# Patient Record
Sex: Male | Born: 1990
Health system: Southern US, Community
[De-identification: ages and names within clinical notes are randomized; demographics above are authoritative.]

---

## 2019-02-14 ENCOUNTER — Emergency Department (HOSPITAL_COMMUNITY): Payer: Self-pay

## 2019-02-14 ENCOUNTER — Inpatient Hospital Stay (HOSPITAL_COMMUNITY)
Admission: EM | Admit: 2019-02-14 | Discharge: 2019-02-18 | DRG: 493 | Disposition: A | Discharge status: Home Health | Payer: Self-pay | Attending: General Surgery | Admitting: General Surgery

## 2019-02-14 DIAGNOSIS — T148XXA Other injury of unspecified body region, initial encounter: Secondary | ICD-10-CM

## 2019-02-14 DIAGNOSIS — N179 Acute kidney failure, unspecified: Secondary | ICD-10-CM | POA: Diagnosis present

## 2019-02-14 DIAGNOSIS — J982 Interstitial emphysema: Secondary | ICD-10-CM

## 2019-02-14 DIAGNOSIS — S82202B Unspecified fracture of shaft of left tibia, initial encounter for open fracture type I or II: Secondary | ICD-10-CM

## 2019-02-14 DIAGNOSIS — S80212A Abrasion, left knee, initial encounter: Secondary | ICD-10-CM

## 2019-02-14 DIAGNOSIS — Z20828 Contact with and (suspected) exposure to other viral communicable diseases: Secondary | ICD-10-CM | POA: Diagnosis present

## 2019-02-14 DIAGNOSIS — S80211A Abrasion, right knee, initial encounter: Secondary | ICD-10-CM

## 2019-02-14 DIAGNOSIS — Z419 Encounter for procedure for purposes other than remedying health state, unspecified: Secondary | ICD-10-CM

## 2019-02-14 DIAGNOSIS — S060X9A Concussion with loss of consciousness of unspecified duration, initial encounter: Secondary | ICD-10-CM | POA: Diagnosis present

## 2019-02-14 DIAGNOSIS — S060XAA Concussion with loss of consciousness status unknown, initial encounter: Secondary | ICD-10-CM | POA: Diagnosis present

## 2019-02-14 DIAGNOSIS — E876 Hypokalemia: Secondary | ICD-10-CM

## 2019-02-14 DIAGNOSIS — T754XXA Electrocution, initial encounter: Secondary | ICD-10-CM

## 2019-02-14 DIAGNOSIS — Y92410 Unspecified street and highway as the place of occurrence of the external cause: Secondary | ICD-10-CM

## 2019-02-14 DIAGNOSIS — S92002A Unspecified fracture of left calcaneus, initial encounter for closed fracture: Secondary | ICD-10-CM | POA: Diagnosis present

## 2019-02-14 DIAGNOSIS — S82842A Displaced bimalleolar fracture of left lower leg, initial encounter for closed fracture: Secondary | ICD-10-CM | POA: Diagnosis present

## 2019-02-14 DIAGNOSIS — S82402B Unspecified fracture of shaft of left fibula, initial encounter for open fracture type I or II: Secondary | ICD-10-CM

## 2019-02-14 DIAGNOSIS — S93432A Sprain of tibiofibular ligament of left ankle, initial encounter: Secondary | ICD-10-CM | POA: Diagnosis present

## 2019-02-14 DIAGNOSIS — S82251B Displaced comminuted fracture of shaft of right tibia, initial encounter for open fracture type I or II: Secondary | ICD-10-CM | POA: Diagnosis present

## 2019-02-14 DIAGNOSIS — S82892B Other fracture of left lower leg, initial encounter for open fracture type I or II: Secondary | ICD-10-CM

## 2019-02-14 DIAGNOSIS — R739 Hyperglycemia, unspecified: Secondary | ICD-10-CM | POA: Diagnosis present

## 2019-02-14 DIAGNOSIS — S9305XA Dislocation of left ankle joint, initial encounter: Secondary | ICD-10-CM | POA: Diagnosis present

## 2019-02-14 DIAGNOSIS — S82451B Displaced comminuted fracture of shaft of right fibula, initial encounter for open fracture type I or II: Principal | ICD-10-CM | POA: Diagnosis present

## 2019-02-14 DIAGNOSIS — T1490XA Injury, unspecified, initial encounter: Secondary | ICD-10-CM

## 2019-02-14 LAB — CBC
HCT: 50.7 % (ref 39.0–52.0)
Hemoglobin: 16.7 g/dL (ref 13.0–17.0)
MCH: 30.6 pg (ref 26.0–34.0)
MCHC: 32.9 g/dL (ref 30.0–36.0)
MCV: 92.9 fL (ref 80.0–100.0)
Platelets: 482 10*3/uL — ABNORMAL HIGH (ref 150–400)
RBC: 5.46 MIL/uL (ref 4.22–5.81)
RDW: 12 % (ref 11.5–15.5)
WBC: 37 10*3/uL — ABNORMAL HIGH (ref 4.0–10.5)
nRBC: 0 % (ref 0.0–0.2)

## 2019-02-14 LAB — PROTIME-INR
INR: 1 (ref 0.8–1.2)
Prothrombin Time: 13.2 seconds (ref 11.4–15.2)

## 2019-02-14 LAB — ETHANOL: Alcohol, Ethyl (B): <10 mg/dL (ref <10)

## 2019-02-14 MED ORDER — TETANUS-DIPHTH-ACELL PERTUSSIS 5-2.5-18.5 LF-MCG/0.5 IM SUSP
0.5000 mL | Freq: Once | INTRAMUSCULAR | Status: AC
  Administered 2019-02-15: 0.5 mL via INTRAMUSCULAR
  Filled 2019-02-14: qty 0.5

## 2019-02-14 MED ORDER — CLINDAMYCIN PHOSPHATE 600 MG/50ML IV SOLN
600.0000 mg | Freq: Once | INTRAVENOUS | Status: AC
  Administered 2019-02-15: 600 mg via INTRAVENOUS
  Filled 2019-02-14: qty 50

## 2019-02-14 NOTE — ED Notes (Addendum)
Dr Palumbo informed of chem 8 results  

## 2019-02-15 ENCOUNTER — Inpatient Hospital Stay (HOSPITAL_COMMUNITY): Payer: Self-pay | Admitting: Anesthesiology

## 2019-02-15 ENCOUNTER — Encounter (HOSPITAL_COMMUNITY): Payer: Self-pay | Admitting: Emergency Medicine

## 2019-02-15 ENCOUNTER — Inpatient Hospital Stay (HOSPITAL_COMMUNITY): Payer: Self-pay

## 2019-02-15 ENCOUNTER — Encounter (HOSPITAL_COMMUNITY): Admission: EM | Disposition: A | Discharge status: Home Health | Payer: Self-pay | Source: Home / Self Care

## 2019-02-15 ENCOUNTER — Other Ambulatory Visit: Payer: Self-pay

## 2019-02-15 ENCOUNTER — Other Ambulatory Visit (HOSPITAL_COMMUNITY): Payer: Self-pay | Admitting: Orthopedic Surgery

## 2019-02-15 DIAGNOSIS — S060XAA Concussion with loss of consciousness status unknown, initial encounter: Secondary | ICD-10-CM | POA: Diagnosis present

## 2019-02-15 DIAGNOSIS — S060X9A Concussion with loss of consciousness of unspecified duration, initial encounter: Secondary | ICD-10-CM | POA: Diagnosis present

## 2019-02-15 HISTORY — PX: I & D EXTREMITY: SHX5045

## 2019-02-15 HISTORY — PX: EXTERNAL FIXATION LEG: SHX1549

## 2019-02-15 LAB — TYPE AND SCREEN
ABO/RH(D): O POS
Antibody Screen: NEGATIVE

## 2019-02-15 LAB — URINALYSIS, ROUTINE W REFLEX MICROSCOPIC
Bilirubin Urine: NEGATIVE
Glucose, UA: 50 mg/dL — AB
Ketones, ur: NEGATIVE mg/dL
Leukocytes,Ua: NEGATIVE
Nitrite: NEGATIVE
Protein, ur: NEGATIVE mg/dL
Specific Gravity, Urine: 1.033 — ABNORMAL HIGH (ref 1.005–1.030)
pH: 6 (ref 5.0–8.0)

## 2019-02-15 LAB — COMPREHENSIVE METABOLIC PANEL
ALT: 34 U/L (ref 0–44)
AST: 47 U/L — ABNORMAL HIGH (ref 15–41)
Albumin: 4.1 g/dL (ref 3.5–5.0)
Alkaline Phosphatase: 66 U/L (ref 38–126)
Anion gap: 16 — ABNORMAL HIGH (ref 5–15)
BUN: 8 mg/dL (ref 6–20)
CO2: 18 mmol/L — ABNORMAL LOW (ref 22–32)
Calcium: 8.4 mg/dL — ABNORMAL LOW (ref 8.9–10.3)
Chloride: 104 mmol/L (ref 98–111)
Creatinine, Ser: 1.37 mg/dL — ABNORMAL HIGH (ref 0.61–1.24)
GFR calc Af Amer: 36 mL/min — ABNORMAL LOW (ref >60)
GFR calc non Af Amer: 31 mL/min — ABNORMAL LOW (ref >60)
Glucose, Bld: 156 mg/dL — ABNORMAL HIGH (ref 70–99)
Potassium: 3.1 mmol/L — ABNORMAL LOW (ref 3.5–5.1)
Sodium: 138 mmol/L (ref 135–145)
Total Bilirubin: 0.9 mg/dL (ref 0.3–1.2)
Total Protein: 7 g/dL (ref 6.5–8.1)

## 2019-02-15 LAB — CDS SEROLOGY

## 2019-02-15 LAB — I-STAT CHEM 8, ED
BUN: 10 mg/dL (ref 6–20)
BUN: 12 mg/dL (ref 6–20)
Calcium, Ion: 0.86 mmol/L — CL (ref 1.15–1.40)
Calcium, Ion: 0.96 mmol/L — ABNORMAL LOW (ref 1.15–1.40)
Chloride: 107 mmol/L (ref 98–111)
Chloride: 108 mmol/L (ref 98–111)
Creatinine, Ser: 1.2 mg/dL (ref 0.61–1.24)
Creatinine, Ser: 1.3 mg/dL — ABNORMAL HIGH (ref 0.61–1.24)
Glucose, Bld: 153 mg/dL — ABNORMAL HIGH (ref 70–99)
Glucose, Bld: 162 mg/dL — ABNORMAL HIGH (ref 70–99)
HCT: 48 % (ref 39.0–52.0)
HCT: 51 % (ref 39.0–52.0)
Hemoglobin: 16.3 g/dL (ref 13.0–17.0)
Hemoglobin: 17.3 g/dL — ABNORMAL HIGH (ref 13.0–17.0)
Potassium: 2.9 mmol/L — ABNORMAL LOW (ref 3.5–5.1)
Potassium: 6.8 mmol/L (ref 3.5–5.1)
Sodium: 138 mmol/L (ref 135–145)
Sodium: 142 mmol/L (ref 135–145)
TCO2: 20 mmol/L — ABNORMAL LOW (ref 22–32)
TCO2: 22 mmol/L (ref 22–32)

## 2019-02-15 LAB — POCT I-STAT, CHEM 8
BUN: 12 mg/dL (ref 6–20)
Calcium, Ion: 0.86 mmol/L — CL (ref 1.15–1.40)
Chloride: 108 mmol/L (ref 98–111)
Creatinine, Ser: 1.3 mg/dL — ABNORMAL HIGH (ref 0.61–1.24)
Glucose, Bld: 162 mg/dL — ABNORMAL HIGH (ref 70–99)
HCT: 48 % (ref 39.0–52.0)
Hemoglobin: 16.3 g/dL (ref 13.0–17.0)
Potassium: 6.8 mmol/L (ref 3.5–5.1)
Sodium: 138 mmol/L (ref 135–145)
TCO2: 22 mmol/L (ref 22–32)

## 2019-02-15 LAB — BASIC METABOLIC PANEL
Anion gap: 8 (ref 5–15)
BUN: 8 mg/dL (ref 6–20)
CO2: 22 mmol/L (ref 22–32)
Calcium: 8.1 mg/dL — ABNORMAL LOW (ref 8.9–10.3)
Chloride: 110 mmol/L (ref 98–111)
Creatinine, Ser: 1.27 mg/dL — ABNORMAL HIGH (ref 0.61–1.24)
GFR calc Af Amer: >60 mL/min (ref >60)
GFR calc non Af Amer: >60 mL/min (ref >60)
Glucose, Bld: 164 mg/dL — ABNORMAL HIGH (ref 70–99)
Potassium: 4.3 mmol/L (ref 3.5–5.1)
Sodium: 140 mmol/L (ref 135–145)

## 2019-02-15 LAB — ABO/RH: ABO/RH(D): O POS

## 2019-02-15 LAB — LACTIC ACID, PLASMA: Lactic Acid, Venous: 5.3 mmol/L (ref 0.5–1.9)

## 2019-02-15 LAB — SARS CORONAVIRUS 2 BY RT PCR (HOSPITAL ORDER, PERFORMED IN ~~LOC~~ HOSPITAL LAB): SARS Coronavirus 2: NEGATIVE

## 2019-02-15 LAB — MAGNESIUM
Magnesium: 1.8 mg/dL (ref 1.7–2.4)
Magnesium: 1.9 mg/dL (ref 1.7–2.4)

## 2019-02-15 LAB — MRSA PCR SCREENING: MRSA by PCR: NEGATIVE

## 2019-02-15 SURGERY — EXTERNAL FIXATION, LOWER EXTREMITY
Anesthesia: General | Site: Leg Lower | Laterality: Right

## 2019-02-15 MED ORDER — CEFAZOLIN SODIUM-DEXTROSE 2-4 GM/100ML-% IV SOLN: 2.0000 g | INTRAVENOUS | Status: DC

## 2019-02-15 MED ORDER — HYDRALAZINE HCL 20 MG/ML IJ SOLN: 10.0000 mg | INTRAMUSCULAR | Status: DC | PRN

## 2019-02-15 MED ORDER — SODIUM CHLORIDE 0.9 % IV SOLN
INTRAVENOUS | Status: DC | PRN
  Administered 2019-02-15: 25 ug/min via INTRAVENOUS

## 2019-02-15 MED ORDER — MIDAZOLAM HCL 5 MG/5ML IJ SOLN
INTRAMUSCULAR | Status: DC | PRN
  Administered 2019-02-15: 1 mg via INTRAVENOUS

## 2019-02-15 MED ORDER — DEXAMETHASONE SODIUM PHOSPHATE 10 MG/ML IJ SOLN
INTRAMUSCULAR | Status: AC
  Filled 2019-02-15: qty 1

## 2019-02-15 MED ORDER — PANTOPRAZOLE SODIUM 40 MG PO TBEC
40.0000 mg | DELAYED_RELEASE_TABLET | Freq: Every day | ORAL | Status: DC
  Administered 2019-02-15 – 2019-02-18 (×3): 40 mg via ORAL
  Filled 2019-02-15 (×3): qty 1

## 2019-02-15 MED ORDER — ONDANSETRON HCL 4 MG/2ML IJ SOLN
INTRAMUSCULAR | Status: AC
  Filled 2019-02-15: qty 2

## 2019-02-15 MED ORDER — SUCCINYLCHOLINE CHLORIDE 200 MG/10ML IV SOSY
PREFILLED_SYRINGE | INTRAVENOUS | Status: AC
  Filled 2019-02-15: qty 10

## 2019-02-15 MED ORDER — PHENYLEPHRINE HCL (PRESSORS) 10 MG/ML IV SOLN
INTRAVENOUS | Status: DC | PRN
  Administered 2019-02-15: 80 ug via INTRAVENOUS

## 2019-02-15 MED ORDER — LACTATED RINGERS IV SOLN: INTRAVENOUS | Status: DC

## 2019-02-15 MED ORDER — SODIUM CHLORIDE 0.9 % IR SOLN
Status: DC | PRN
  Administered 2019-02-15: 1000 mL

## 2019-02-15 MED ORDER — PROPOFOL 10 MG/ML IV BOLUS
INTRAVENOUS | Status: DC | PRN
  Administered 2019-02-15: 150 mg via INTRAVENOUS

## 2019-02-15 MED ORDER — MAGNESIUM CITRATE PO SOLN: 1.0000 | Freq: Once | ORAL | Status: DC | PRN

## 2019-02-15 MED ORDER — ONDANSETRON HCL 4 MG PO TABS: 4.0000 mg | ORAL_TABLET | Freq: Four times a day (QID) | ORAL | Status: DC | PRN

## 2019-02-15 MED ORDER — CEFAZOLIN SODIUM-DEXTROSE 1-4 GM/50ML-% IV SOLN
1.0000 g | Freq: Four times a day (QID) | INTRAVENOUS | Status: AC
  Administered 2019-02-15 (×3): 1 g via INTRAVENOUS
  Filled 2019-02-15 (×3): qty 50

## 2019-02-15 MED ORDER — MIDAZOLAM HCL 2 MG/2ML IJ SOLN
INTRAMUSCULAR | Status: AC
  Filled 2019-02-15: qty 2

## 2019-02-15 MED ORDER — ACETAMINOPHEN 325 MG PO TABS: 325.0000 mg | ORAL_TABLET | Freq: Four times a day (QID) | ORAL | Status: DC | PRN

## 2019-02-15 MED ORDER — POTASSIUM CHLORIDE IN NACL 20-0.9 MEQ/L-% IV SOLN
INTRAVENOUS | Status: DC
  Administered 2019-02-15 – 2019-02-16 (×3): via INTRAVENOUS
  Filled 2019-02-15 (×5): qty 1000

## 2019-02-15 MED ORDER — SENNA 8.6 MG PO TABS
1.0000 | ORAL_TABLET | Freq: Two times a day (BID) | ORAL | Status: DC
  Administered 2019-02-15 (×2): 8.6 mg via ORAL
  Filled 2019-02-15 (×2): qty 1

## 2019-02-15 MED ORDER — CEFAZOLIN SODIUM-DEXTROSE 2-4 GM/100ML-% IV SOLN
INTRAVENOUS | Status: AC
  Filled 2019-02-15: qty 100

## 2019-02-15 MED ORDER — LIDOCAINE 2% (20 MG/ML) 5 ML SYRINGE
INTRAMUSCULAR | Status: DC | PRN
  Administered 2019-02-15: 40 mg via INTRAVENOUS

## 2019-02-15 MED ORDER — SUGAMMADEX SODIUM 200 MG/2ML IV SOLN
INTRAVENOUS | Status: DC | PRN
  Administered 2019-02-15: 200 mg via INTRAVENOUS

## 2019-02-15 MED ORDER — ENOXAPARIN SODIUM 40 MG/0.4ML ~~LOC~~ SOLN: 40.0000 mg | SUBCUTANEOUS | Status: DC

## 2019-02-15 MED ORDER — OXYCODONE HCL 5 MG PO TABS: 5.0000 mg | ORAL_TABLET | ORAL | Status: DC | PRN

## 2019-02-15 MED ORDER — FENTANYL CITRATE (PF) 100 MCG/2ML IJ SOLN
50.0000 ug | Freq: Once | INTRAMUSCULAR | Status: AC
  Administered 2019-02-15: 50 ug via INTRAVENOUS

## 2019-02-15 MED ORDER — POTASSIUM CHLORIDE 10 MEQ/100ML IV SOLN
10.0000 meq | INTRAVENOUS | Status: AC
  Administered 2019-02-15 (×3): 10 meq via INTRAVENOUS
  Filled 2019-02-15 (×4): qty 100

## 2019-02-15 MED ORDER — MAGNESIUM SULFATE IN D5W 1-5 GM/100ML-% IV SOLN
1.0000 g | Freq: Once | INTRAVENOUS | Status: AC
  Administered 2019-02-15: 1 g via INTRAVENOUS
  Filled 2019-02-15: qty 100

## 2019-02-15 MED ORDER — OXYCODONE HCL 5 MG PO TABS: 10.0000 mg | ORAL_TABLET | ORAL | Status: DC | PRN

## 2019-02-15 MED ORDER — HYDROMORPHONE HCL 1 MG/ML IJ SOLN: 1.0000 mg | INTRAMUSCULAR | Status: DC | PRN

## 2019-02-15 MED ORDER — POVIDONE-IODINE 10 % EX SWAB: 2.0000 "application " | Freq: Once | CUTANEOUS | Status: DC

## 2019-02-15 MED ORDER — CHLORHEXIDINE GLUCONATE 4 % EX LIQD
60.0000 mL | Freq: Once | CUTANEOUS | Status: DC
  Filled 2019-02-15 (×2): qty 60

## 2019-02-15 MED ORDER — ALBUMIN HUMAN 5 % IV SOLN
INTRAVENOUS | Status: DC | PRN
  Administered 2019-02-15: 04:00:00 via INTRAVENOUS

## 2019-02-15 MED ORDER — CEFAZOLIN SODIUM-DEXTROSE 1-4 GM/50ML-% IV SOLN: 1.0000 g | Freq: Three times a day (TID) | INTRAVENOUS | Status: DC

## 2019-02-15 MED ORDER — PANTOPRAZOLE SODIUM 40 MG IV SOLR
40.0000 mg | Freq: Every day | INTRAVENOUS | Status: DC
  Filled 2019-02-15 (×2): qty 40

## 2019-02-15 MED ORDER — FENTANYL CITRATE (PF) 100 MCG/2ML IJ SOLN
INTRAMUSCULAR | Status: DC | PRN
  Administered 2019-02-15 (×5): 50 ug via INTRAVENOUS
  Administered 2019-02-15: 100 ug via INTRAVENOUS
  Administered 2019-02-15: 50 ug via INTRAVENOUS

## 2019-02-15 MED ORDER — ROCURONIUM BROMIDE 100 MG/10ML IV SOLN
INTRAVENOUS | Status: DC | PRN
  Administered 2019-02-15: 20 mg via INTRAVENOUS
  Administered 2019-02-15: 50 mg via INTRAVENOUS
  Administered 2019-02-15: 20 mg via INTRAVENOUS
  Administered 2019-02-15 (×2): 10 mg via INTRAVENOUS

## 2019-02-15 MED ORDER — LIDOCAINE 2% (20 MG/ML) 5 ML SYRINGE
INTRAMUSCULAR | Status: AC
  Filled 2019-02-15: qty 5

## 2019-02-15 MED ORDER — HYDROMORPHONE HCL 1 MG/ML IJ SOLN
0.5000 mg | INTRAMUSCULAR | Status: DC | PRN
  Administered 2019-02-15 (×5): 2 mg via INTRAVENOUS
  Administered 2019-02-16: 1 mg via INTRAVENOUS
  Administered 2019-02-16: 2 mg via INTRAVENOUS
  Filled 2019-02-15 (×2): qty 2
  Filled 2019-02-15: qty 1
  Filled 2019-02-15 (×3): qty 2

## 2019-02-15 MED ORDER — ACETAMINOPHEN 160 MG/5ML PO SOLN: 325.0000 mg | Freq: Once | ORAL | Status: DC | PRN

## 2019-02-15 MED ORDER — HYDROMORPHONE HCL 1 MG/ML IJ SOLN
0.5000 mg | INTRAMUSCULAR | Status: DC | PRN
  Administered 2019-02-15: 1 mg via INTRAVENOUS
  Filled 2019-02-15: qty 1

## 2019-02-15 MED ORDER — SODIUM CHLORIDE 0.9 % IV SOLN
INTRAVENOUS | Status: DC | PRN
  Administered 2019-02-15: 02:00:00 via INTRAVENOUS

## 2019-02-15 MED ORDER — MEPERIDINE HCL 25 MG/ML IJ SOLN: 6.2500 mg | INTRAMUSCULAR | Status: DC | PRN

## 2019-02-15 MED ORDER — BACITRACIN ZINC 500 UNIT/GM EX OINT
TOPICAL_OINTMENT | CUTANEOUS | Status: AC
  Filled 2019-02-15: qty 28.35

## 2019-02-15 MED ORDER — IOHEXOL 300 MG/ML  SOLN
100.0000 mL | Freq: Once | INTRAMUSCULAR | Status: AC | PRN
  Administered 2019-02-15: 100 mL via INTRAVENOUS

## 2019-02-15 MED ORDER — METHOCARBAMOL 1000 MG/10ML IJ SOLN
1000.0000 mg | Freq: Three times a day (TID) | INTRAVENOUS | Status: DC | PRN
  Filled 2019-02-15: qty 10

## 2019-02-15 MED ORDER — ONDANSETRON HCL 4 MG/2ML IJ SOLN
INTRAMUSCULAR | Status: DC | PRN
  Administered 2019-02-15: 4 mg via INTRAVENOUS

## 2019-02-15 MED ORDER — SUCCINYLCHOLINE CHLORIDE 20 MG/ML IJ SOLN
INTRAMUSCULAR | Status: DC | PRN
  Administered 2019-02-15: 140 mg via INTRAVENOUS

## 2019-02-15 MED ORDER — KETOROLAC TROMETHAMINE 15 MG/ML IJ SOLN
15.0000 mg | Freq: Four times a day (QID) | INTRAMUSCULAR | Status: AC
  Administered 2019-02-15 – 2019-02-16 (×4): 15 mg via INTRAVENOUS
  Filled 2019-02-15 (×4): qty 1

## 2019-02-15 MED ORDER — PHENYLEPHRINE 40 MCG/ML (10ML) SYRINGE FOR IV PUSH (FOR BLOOD PRESSURE SUPPORT)
PREFILLED_SYRINGE | INTRAVENOUS | Status: AC
  Filled 2019-02-15: qty 10

## 2019-02-15 MED ORDER — GABAPENTIN 300 MG PO CAPS
300.0000 mg | ORAL_CAPSULE | Freq: Three times a day (TID) | ORAL | Status: DC
  Administered 2019-02-15 – 2019-02-18 (×8): 300 mg via ORAL
  Filled 2019-02-15 (×8): qty 1

## 2019-02-15 MED ORDER — CHLORHEXIDINE GLUCONATE CLOTH 2 % EX PADS
6.0000 | MEDICATED_PAD | Freq: Every day | CUTANEOUS | Status: DC
  Administered 2019-02-15 – 2019-02-18 (×4): 6 via TOPICAL

## 2019-02-15 MED ORDER — PROPOFOL 10 MG/ML IV BOLUS
INTRAVENOUS | Status: AC
  Filled 2019-02-15: qty 40

## 2019-02-15 MED ORDER — ONDANSETRON HCL 4 MG/2ML IJ SOLN: 4.0000 mg | Freq: Four times a day (QID) | INTRAMUSCULAR | Status: DC | PRN

## 2019-02-15 MED ORDER — HYDROMORPHONE HCL 1 MG/ML IJ SOLN
0.2500 mg | INTRAMUSCULAR | Status: DC | PRN
  Administered 2019-02-15: 0.5 mg via INTRAVENOUS

## 2019-02-15 MED ORDER — ONDANSETRON 4 MG PO TBDP: 4.0000 mg | ORAL_TABLET | Freq: Four times a day (QID) | ORAL | Status: DC | PRN

## 2019-02-15 MED ORDER — BACITRACIN ZINC 500 UNIT/GM EX OINT
TOPICAL_OINTMENT | Freq: Two times a day (BID) | CUTANEOUS | Status: DC
  Administered 2019-02-15: 1 via TOPICAL
  Administered 2019-02-15 – 2019-02-18 (×6): via TOPICAL
  Filled 2019-02-15 (×2): qty 28.4

## 2019-02-15 MED ORDER — FENTANYL CITRATE (PF) 100 MCG/2ML IJ SOLN
INTRAMUSCULAR | Status: AC
  Filled 2019-02-15: qty 2

## 2019-02-15 MED ORDER — DOCUSATE SODIUM 100 MG PO CAPS
100.0000 mg | ORAL_CAPSULE | Freq: Two times a day (BID) | ORAL | Status: DC
  Administered 2019-02-15 (×2): 100 mg via ORAL
  Filled 2019-02-15 (×2): qty 1

## 2019-02-15 MED ORDER — LACTATED RINGERS IV SOLN
INTRAVENOUS | Status: DC | PRN
  Administered 2019-02-15 (×3): via INTRAVENOUS

## 2019-02-15 MED ORDER — PROMETHAZINE HCL 25 MG/ML IJ SOLN: 6.2500 mg | INTRAMUSCULAR | Status: DC | PRN

## 2019-02-15 MED ORDER — POTASSIUM CHLORIDE 10 MEQ/100ML IV SOLN
10.0000 meq | INTRAVENOUS | Status: DC
  Filled 2019-02-15: qty 100

## 2019-02-15 MED ORDER — HYDROMORPHONE HCL 1 MG/ML IJ SOLN
INTRAMUSCULAR | Status: AC
  Administered 2019-02-15: 2 mg via INTRAVENOUS
  Filled 2019-02-15: qty 2

## 2019-02-15 MED ORDER — ACETAMINOPHEN 10 MG/ML IV SOLN
INTRAVENOUS | Status: AC
  Administered 2019-02-15: 1000 mg via INTRAVENOUS
  Filled 2019-02-15: qty 100

## 2019-02-15 MED ORDER — FENTANYL CITRATE (PF) 250 MCG/5ML IJ SOLN
INTRAMUSCULAR | Status: AC
  Filled 2019-02-15: qty 5

## 2019-02-15 MED ORDER — HYDROMORPHONE HCL 1 MG/ML IJ SOLN: 1.0000 mg | Freq: Once | INTRAMUSCULAR | Status: AC

## 2019-02-15 MED ORDER — HYDROMORPHONE HCL 1 MG/ML IJ SOLN
INTRAMUSCULAR | Status: AC
  Filled 2019-02-15: qty 1

## 2019-02-15 MED ORDER — CEFAZOLIN SODIUM-DEXTROSE 2-3 GM-%(50ML) IV SOLR
INTRAVENOUS | Status: DC | PRN
  Administered 2019-02-15: 2 g via INTRAVENOUS

## 2019-02-15 MED ORDER — DEXAMETHASONE SODIUM PHOSPHATE 10 MG/ML IJ SOLN
INTRAMUSCULAR | Status: DC | PRN
  Administered 2019-02-15: 10 mg via INTRAVENOUS

## 2019-02-15 MED ORDER — ROCURONIUM BROMIDE 10 MG/ML (PF) SYRINGE
PREFILLED_SYRINGE | INTRAVENOUS | Status: AC
  Filled 2019-02-15: qty 20

## 2019-02-15 MED ORDER — ACETAMINOPHEN 10 MG/ML IV SOLN
1000.0000 mg | Freq: Once | INTRAVENOUS | Status: DC | PRN
  Administered 2019-02-15: 1000 mg via INTRAVENOUS

## 2019-02-15 MED ORDER — POLYETHYLENE GLYCOL 3350 17 G PO PACK
17.0000 g | PACK | Freq: Every day | ORAL | Status: DC | PRN
  Administered 2019-02-18: 17 g via ORAL
  Filled 2019-02-15: qty 1

## 2019-02-15 MED ORDER — ACETAMINOPHEN 325 MG PO TABS: 650.0000 mg | ORAL_TABLET | ORAL | Status: DC | PRN

## 2019-02-15 MED ORDER — BISACODYL 10 MG RE SUPP: 10.0000 mg | Freq: Every day | RECTAL | Status: DC | PRN

## 2019-02-15 MED ORDER — EPHEDRINE 5 MG/ML INJ
INTRAVENOUS | Status: AC
  Filled 2019-02-15: qty 10

## 2019-02-15 MED ORDER — ACETAMINOPHEN 325 MG PO TABS: 325.0000 mg | ORAL_TABLET | Freq: Once | ORAL | Status: DC | PRN

## 2019-02-15 SURGICAL SUPPLY — 81 items
BANDAGE ESMARK 6X9 LF (GAUZE/BANDAGES/DRESSINGS) ×2 IMPLANT
BIT DRILL 3.8X6 NS (BIT) ×2 IMPLANT
BIT DRILL 4.4 NS (BIT) ×2 IMPLANT
BLADE SURG 10 STRL SS (BLADE) ×4 IMPLANT
BNDG COHESIVE 4X5 TAN STRL (GAUZE/BANDAGES/DRESSINGS) ×4 IMPLANT
BNDG COHESIVE 6X5 TAN STRL LF (GAUZE/BANDAGES/DRESSINGS) ×4 IMPLANT
BNDG CONFORM 3 STRL LF (GAUZE/BANDAGES/DRESSINGS) ×4 IMPLANT
BNDG ELASTIC 6X15 VLCR STRL LF (GAUZE/BANDAGES/DRESSINGS) ×2 IMPLANT
BNDG ELASTIC 6X5.8 VLCR STR LF (GAUZE/BANDAGES/DRESSINGS) ×2 IMPLANT
BNDG ESMARK 6X9 LF (GAUZE/BANDAGES/DRESSINGS) ×4
CANISTER SUCT 3000ML PPV (MISCELLANEOUS) ×4 IMPLANT
CHLORAPREP W/TINT 26 (MISCELLANEOUS) ×6 IMPLANT
COVER SURGICAL LIGHT HANDLE (MISCELLANEOUS) ×4 IMPLANT
COVER WAND RF STERILE (DRAPES) ×2 IMPLANT
CUFF TOURN SGL QUICK 34 (TOURNIQUET CUFF) ×2
CUFF TOURN SGL QUICK 42 (TOURNIQUET CUFF) IMPLANT
CUFF TRNQT CYL 34X4.125X (TOURNIQUET CUFF) ×2 IMPLANT
DRAPE C-ARM 42X72 X-RAY (DRAPES) ×4 IMPLANT
DRAPE U-SHAPE 47X51 STRL (DRAPES) ×4 IMPLANT
DRSG ADAPTIC 3X8 NADH LF (GAUZE/BANDAGES/DRESSINGS) IMPLANT
DRSG MEPITEL 3X4 ME34 (GAUZE/BANDAGES/DRESSINGS) ×2 IMPLANT
DRSG MEPITEL 4X7.2 (GAUZE/BANDAGES/DRESSINGS) ×2 IMPLANT
DRSG PAD ABDOMINAL 8X10 ST (GAUZE/BANDAGES/DRESSINGS) IMPLANT
ELECT REM PT RETURN 9FT ADLT (ELECTROSURGICAL) ×4
ELECTRODE REM PT RTRN 9FT ADLT (ELECTROSURGICAL) ×2 IMPLANT
EVACUATOR SILICONE 100CC (DRAIN) IMPLANT
GAUZE SPONGE 4X4 12PLY STRL (GAUZE/BANDAGES/DRESSINGS) ×2 IMPLANT
GLOVE BIO SURGEON STRL SZ8 (GLOVE) ×6 IMPLANT
GLOVE BIOGEL PI IND STRL 8 (GLOVE) ×4 IMPLANT
GLOVE BIOGEL PI INDICATOR 8 (GLOVE) ×4
GLOVE ECLIPSE 8.0 STRL XLNG CF (GLOVE) ×2 IMPLANT
GOWN STRL REUS W/ TWL LRG LVL3 (GOWN DISPOSABLE) ×2 IMPLANT
GOWN STRL REUS W/ TWL XL LVL3 (GOWN DISPOSABLE) ×4 IMPLANT
GOWN STRL REUS W/TWL LRG LVL3 (GOWN DISPOSABLE) ×4
GOWN STRL REUS W/TWL XL LVL3 (GOWN DISPOSABLE) ×4
GUIDEPIN 3.2X17.5 THRD DISP (PIN) ×2 IMPLANT
GUIDEWIRE BALL NOSE 80CM (WIRE) ×2 IMPLANT
KIT BASIN OR (CUSTOM PROCEDURE TRAY) ×4 IMPLANT
KIT TURNOVER KIT B (KITS) ×4 IMPLANT
NAIL TIBIAL 9MMX36CM (Nail) ×2 IMPLANT
NEEDLE 22X1 1/2 (OR ONLY) (NEEDLE) IMPLANT
NS IRRIG 1000ML POUR BTL (IV SOLUTION) ×4 IMPLANT
PACK ORTHO EXTREMITY (CUSTOM PROCEDURE TRAY) ×4 IMPLANT
PAD ABD 8X10 STRL (GAUZE/BANDAGES/DRESSINGS) ×2 IMPLANT
PAD ARMBOARD 7.5X6 YLW CONV (MISCELLANEOUS) ×8 IMPLANT
PAD CAST 4YDX4 CTTN HI CHSV (CAST SUPPLIES) ×2 IMPLANT
PADDING CAST COTTON 4X4 STRL (CAST SUPPLIES) ×2
SCREW ACECAP 44MM (Screw) ×2 IMPLANT
SCREW ACECAP 52MM (Screw) ×2 IMPLANT
SCREW ACECAP 56MM (Screw) ×2 IMPLANT
SCREW PROXIMAL DEPUY (Screw) ×4 IMPLANT
SCREW PRXML FT 55X5.5XNS TIB (Screw) IMPLANT
SCREW PRXML FT 65X5.5XNS CORT (Screw) IMPLANT
SET CYSTO W/LG BORE CLAMP LF (SET/KITS/TRAYS/PACK) ×6 IMPLANT
SOAP 2 % CHG 4 OZ (WOUND CARE) ×4 IMPLANT
SPLINT PLASTER CAST XFAST 5X30 (CAST SUPPLIES) IMPLANT
SPLINT PLASTER XFAST SET 5X30 (CAST SUPPLIES) ×2
SPONGE LAP 18X18 RF (DISPOSABLE) ×2 IMPLANT
SPONGE LAP 4X18 RFD (DISPOSABLE) ×4 IMPLANT
STAPLER VISISTAT 35W (STAPLE) ×2 IMPLANT
SUCTION FRAZIER HANDLE 10FR (MISCELLANEOUS) ×2
SUCTION TUBE FRAZIER 10FR DISP (MISCELLANEOUS) ×2 IMPLANT
SUT ETHILON 2 0 FS 18 (SUTURE) ×4 IMPLANT
SUT ETHILON 3 0 PS 1 (SUTURE) IMPLANT
SUT MNCRL AB 3-0 PS2 18 (SUTURE) ×2 IMPLANT
SUT PROLENE 3 0 PS 2 (SUTURE) ×2 IMPLANT
SUT VIC AB 0 CT1 27 (SUTURE) ×2
SUT VIC AB 0 CT1 27XBRD ANBCTR (SUTURE) IMPLANT
SUT VIC AB 2-0 CT1 27 (SUTURE)
SUT VIC AB 2-0 CT1 TAPERPNT 27 (SUTURE) IMPLANT
SUT VIC AB 3-0 PS2 18 (SUTURE)
SUT VIC AB 3-0 PS2 18XBRD (SUTURE) ×2 IMPLANT
SYR CONTROL 10ML LL (SYRINGE) IMPLANT
TOWEL GREEN STERILE (TOWEL DISPOSABLE) ×4 IMPLANT
TOWEL GREEN STERILE FF (TOWEL DISPOSABLE) ×6 IMPLANT
TRAY FOLEY W/BAG SLVR 16FR (SET/KITS/TRAYS/PACK) ×2
TRAY FOLEY W/BAG SLVR 16FR ST (SET/KITS/TRAYS/PACK) IMPLANT
TUBE CONNECTING 12'X1/4 (SUCTIONS) ×1
TUBE CONNECTING 12X1/4 (SUCTIONS) ×3 IMPLANT
WATER STERILE IRR 1000ML POUR (IV SOLUTION) ×2 IMPLANT
YANKAUER SUCT BULB TIP NO VENT (SUCTIONS) ×4 IMPLANT
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
## 2019-02-15 NOTE — ED Notes (Signed)
Dr. Doran Durand and Dr. Grandville Silos at bedside.

## 2019-02-15 NOTE — Progress Notes (Addendum)
Subjective: Day of Surgery Procedure(s) (LRB): I&D with Tibial Intramedullary Nailing (Right) Closed Reduction Ankle Dislocation (Left)  Patient reports pain as mild to moderate.  Denies fever, chills, N/V, CP, SOB.  Ready to eat breakfast.    Objective:   VITALS:  Temp:  [98.1 F (36.7 C)-99.2 F (37.3 C)] 99.2 F (37.3 C) (08/03 0600) Pulse Rate:  [95-114] 108 (08/03 0700) Resp:  [18-30] 22 (08/03 0700) BP: (104-141)/(55-88) 135/74 (08/03 0700) SpO2:  [95 %-100 %] 95 % (08/03 0700) Weight:  [99.8 kg-109.7 kg] 109.7 kg (08/03 0600)  General: WDWN patient in NAD. Psych:  Appropriate mood and affect. Neuro:  A&O x 3, Moving all extremities, sensation intact to light touch HEENT:  EOMs intact Chest:  Even non-labored respirations Skin:  Dressing on R LE and SLS on L LE C/D/I, no rashes or lesions Extremities: warm/dry, mild edema, no erythema or echymosis.  No lymphadenopathy. Pulses: Popliteus 2+ MSK:  ROM: EHL/FHL intact, MMT: able to perform quad set, (-) Homan's    LABS Recent Labs    02/14/19 2327 02/14/19 2332 02/14/19 2347  HGB 16.3 16.7 17.3*  WBC  --  37.0*  --   PLT  --  482*  --    Recent Labs    02/14/19 2332 02/14/19 2347  NA 138 142  K 3.1* 2.9*  CL 104 107  CO2 18*  --   BUN 8 10  CREATININE 1.37* 1.20  GLUCOSE 156* 153*   Recent Labs    02/14/19 2332  INR 1.0     Assessment/Plan: Day of Surgery Procedure(s) (LRB): I&D with Tibial Intramedullary Nailing (Right) Closed Reduction Ankle Dislocation (Left)  NWB L LE WBAT R LE in CAM boot. CAM boot ordered Up with therapy CT L ankle ordered To OR tomorrow for ORIF left and fx/syndesmosis repair and calc fx D/C'd lovenox.  Hold blood thinners until after surgery NPO after midnight   Mechele Claude PA-C [facility] Office:  [phone]

## 2019-02-15 NOTE — Progress Notes (Signed)
Orthopedic Tech Progress Note Patient Details:  Willie Martin [id number] 1991-05-22 [id number]  Ortho Devices Type of Ortho Device: CAM walker Ortho Device/Splint Location: right Ortho Device/Splint Interventions: Application   Post Interventions Patient Tolerated: Well Instructions Provided: Care of device   [name] 02/15/2019, 1:19 PM
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
## 2019-02-15 NOTE — Progress Notes (Signed)
Per pt's mother, pt will need a nicotine patch post op, " he will need something because he uses dip"

## 2019-02-15 NOTE — Transfer of Care (Signed)
Immediate Anesthesia Transfer of Care Note  Patient: Willie Martin  Procedure(s) Performed: I&D with Tibial Intramedullary Nailing (Right Leg Lower) Closed Reduction Ankle Dislocation (Left Ankle)  Patient Location: PACU  Anesthesia Type:General  Level of Consciousness: awake and alert   Airway & Oxygen Therapy: Patient Spontanous Breathing  Post-op Assessment: Report given to RN and Post -op Vital signs reviewed and stable  Post vital signs: Reviewed and stable  Last Vitals:  Vitals Value Taken Time  BP 122/65 02/15/19 0453  Temp    Pulse 114 02/15/19 0457  Resp 24 02/15/19 0457  SpO2 100 % 02/15/19 0457  Vitals shown include unvalidated device data.  Last Pain:  Vitals:   02/14/19 2315  TempSrc:   PainSc: 8          Complications: No apparent anesthesia complications

## 2019-02-15 NOTE — ED Notes (Signed)
Returned from CT via Psychologist, occupational and NT

## 2019-02-15 NOTE — Progress Notes (Signed)
Pt's mother and fiance in surgical waiting, updated in person regarding surgery. Contact person is mother Lenna Sciara [place]) [phone].  Pt's family reminded to keep mask on while in hospital, neither one was wearing a mask in waiting area. Both parties had a green arm band on. C.Adley Castello, RN explained multiple times to both mother and pt's fiance that due to COVID-19 precautions, visitors were not allowed in the PACU & were not allowed to visit inpatients at this time. Pt's mother given [facility] nursing station number to find out when visitors would be allowed to visit. C.Jeral Zick, RN also stated due to continued COVID-19 precautions, visitors would be limited to one person, but requested mother confirm with ICU staff later on today.
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
## 2019-02-15 NOTE — Progress Notes (Signed)
Rehab Admissions Coordinator Note:  Patient was screened by Willie Martin for appropriateness for an Inpatient Acute Rehab Consult.  At this time, we will follow along. Noted planned surgery tomorrow. Will watch for therapy evaluations to see if pt appropriate for CIR. If appropriate, will request order.   Willie Martin 02/15/2019, 5:37 PM  I can be reached at [phone].

## 2019-02-15 NOTE — ED Notes (Signed)
OR preparing for patient, will call as soon as they are ready

## 2019-02-15 NOTE — Anesthesia Procedure Notes (Addendum)
Procedure Name: Intubation Date/Time: 02/15/2019 2:08 AM Performed by: Suzy Bouchard, CRNA Pre-anesthesia Checklist: Patient identified, Emergency Drugs available, Suction available and Patient being monitored Patient Re-evaluated:Patient Re-evaluated prior to induction Oxygen Delivery Method: Circle system utilized Preoxygenation: Pre-oxygenation with 100% oxygen Induction Type: IV induction and Rapid sequence Laryngoscope Size: Glidescope and 4 Grade View: Grade I Tube type: Oral Tube size: 7.5 mm Number of attempts: 1 Airway Equipment and Method: Stylet and Video-laryngoscopy Placement Confirmation: ETT inserted through vocal cords under direct vision,  positive ETCO2 and breath sounds checked- equal and bilateral Secured at: 22 cm Tube secured with: Tape Dental Injury: Teeth and Oropharynx as per pre-operative assessment
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
## 2019-02-15 NOTE — Progress Notes (Signed)
Pt's phone & charger placed in clear plastic bag, placed in PACU

## 2019-02-15 NOTE — Anesthesia Preprocedure Evaluation (Signed)
Anesthesia Evaluation  Patient identified by MRN, date of birth, ID band Patient awake    Reviewed: Allergy & Precautions, NPO status , Patient's Chart, lab work & pertinent test results  Airway Mallampati: III  TM Distance: >3 FB Neck ROM: Full    Dental  (+) Teeth Intact, Dental Advisory Given   Pulmonary neg pulmonary ROS,    breath sounds clear to auscultation       Cardiovascular negative cardio ROS   Rhythm:Regular Rate:Normal     Neuro/Psych negative neurological ROS  negative psych ROS   GI/Hepatic negative GI ROS, Neg liver ROS,   Endo/Other  negative endocrine ROS  Renal/GU negative Renal ROS  negative genitourinary   Musculoskeletal negative musculoskeletal ROS (+)   Abdominal Normal abdominal exam  (+)   Peds  Hematology negative hematology ROS (+)   Anesthesia Other Findings   Reproductive/Obstetrics                             Anesthesia Physical Anesthesia Plan  ASA: II and emergent  Anesthesia Plan: General   Post-op Pain Management:    Induction: Rapid sequence, Cricoid pressure planned and Intravenous  PONV Risk Score and Plan: 3 and Ondansetron, Dexamethasone and Midazolam  Airway Management Planned: Oral ETT  Additional Equipment: None  Intra-op Plan:   Post-operative Plan: Extubation in OR  Informed Consent: I have reviewed the patients History and Physical, chart, labs and discussed the procedure including the risks, benefits and alternatives for the proposed anesthesia with the patient or authorized representative who has indicated his/her understanding and acceptance.     Dental advisory given  Plan Discussed with: CRNA  Anesthesia Plan Comments: (COVID-19 Labs  No results for input(s): DDIMER, FERRITIN, LDH, CRP in the last 72 hours.  Lab Results      Component                Value               Date                      [id number]               NEGATIVE            02/14/2019            )        Anesthesia Quick Evaluation

## 2019-02-15 NOTE — ED Notes (Signed)
Chem 8 was ran a second time with a new sample at Dr Randal Buba request. Credit sheet sent to lab
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
## 2019-02-16 ENCOUNTER — Encounter (HOSPITAL_COMMUNITY): Admission: EM | Disposition: A | Discharge status: Home Health | Payer: Self-pay | Source: Home / Self Care

## 2019-02-16 ENCOUNTER — Encounter (HOSPITAL_COMMUNITY): Payer: Self-pay | Admitting: Certified Registered Nurse Anesthetist

## 2019-02-16 ENCOUNTER — Inpatient Hospital Stay (HOSPITAL_COMMUNITY): Payer: Self-pay | Admitting: Certified Registered Nurse Anesthetist

## 2019-02-16 HISTORY — PX: ORIF ANKLE FRACTURE: SHX5408

## 2019-02-16 HISTORY — PX: SYNDESMOSIS REPAIR: SHX5182

## 2019-02-16 HISTORY — PX: ORIF CALCANEOUS FRACTURE: SHX5030

## 2019-02-16 LAB — COMPREHENSIVE METABOLIC PANEL
ALT: 52 U/L — ABNORMAL HIGH (ref 0–44)
AST: 125 U/L — ABNORMAL HIGH (ref 15–41)
Albumin: 2.9 g/dL — ABNORMAL LOW (ref 3.5–5.0)
Alkaline Phosphatase: 39 U/L (ref 38–126)
Anion gap: 8 (ref 5–15)
BUN: 14 mg/dL (ref 6–20)
CO2: 24 mmol/L (ref 22–32)
Calcium: 7.9 mg/dL — ABNORMAL LOW (ref 8.9–10.3)
Chloride: 107 mmol/L (ref 98–111)
Creatinine, Ser: 1.23 mg/dL (ref 0.61–1.24)
GFR calc Af Amer: >60 mL/min (ref >60)
GFR calc non Af Amer: >60 mL/min (ref >60)
Glucose, Bld: 112 mg/dL — ABNORMAL HIGH (ref 70–99)
Potassium: 4.4 mmol/L (ref 3.5–5.1)
Sodium: 139 mmol/L (ref 135–145)
Total Bilirubin: 1 mg/dL (ref 0.3–1.2)
Total Protein: 5.4 g/dL — ABNORMAL LOW (ref 6.5–8.1)

## 2019-02-16 LAB — CBC
HCT: 34.2 % — ABNORMAL LOW (ref 39.0–52.0)
Hemoglobin: 11.2 g/dL — ABNORMAL LOW (ref 13.0–17.0)
MCH: 30.6 pg (ref 26.0–34.0)
MCHC: 32.7 g/dL (ref 30.0–36.0)
MCV: 93.4 fL (ref 80.0–100.0)
Platelets: 247 10*3/uL (ref 150–400)
RBC: 3.66 MIL/uL — ABNORMAL LOW (ref 4.22–5.81)
RDW: 12.3 % (ref 11.5–15.5)
WBC: 10.3 10*3/uL (ref 4.0–10.5)
nRBC: 0 % (ref 0.0–0.2)

## 2019-02-16 LAB — SURGICAL PCR SCREEN
MRSA, PCR: NEGATIVE
Staphylococcus aureus: POSITIVE — AB

## 2019-02-16 SURGERY — OPEN REDUCTION INTERNAL FIXATION (ORIF) ANKLE FRACTURE
Anesthesia: General | Laterality: Left

## 2019-02-16 MED ORDER — BUPIVACAINE HCL (PF) 0.25 % IJ SOLN
INTRAMUSCULAR | Status: AC
  Filled 2019-02-16: qty 30

## 2019-02-16 MED ORDER — MIDAZOLAM HCL 2 MG/2ML IJ SOLN
INTRAMUSCULAR | Status: AC
  Filled 2019-02-16: qty 2

## 2019-02-16 MED ORDER — ACETAMINOPHEN 10 MG/ML IV SOLN
INTRAVENOUS | Status: DC | PRN
  Administered 2019-02-16: 1000 mg via INTRAVENOUS

## 2019-02-16 MED ORDER — BACITRACIN ZINC 500 UNIT/GM EX OINT
TOPICAL_OINTMENT | CUTANEOUS | Status: AC
  Filled 2019-02-16: qty 28.35

## 2019-02-16 MED ORDER — FENTANYL CITRATE (PF) 250 MCG/5ML IJ SOLN
INTRAMUSCULAR | Status: DC | PRN
  Administered 2019-02-16: 50 ug via INTRAVENOUS
  Administered 2019-02-16: 25 ug via INTRAVENOUS
  Administered 2019-02-16: 100 ug via INTRAVENOUS
  Administered 2019-02-16 (×3): 50 ug via INTRAVENOUS
  Administered 2019-02-16: 25 ug via INTRAVENOUS
  Administered 2019-02-16 (×3): 50 ug via INTRAVENOUS

## 2019-02-16 MED ORDER — GLYCOPYRROLATE PF 0.2 MG/ML IJ SOSY
PREFILLED_SYRINGE | INTRAMUSCULAR | Status: AC
  Filled 2019-02-16: qty 1

## 2019-02-16 MED ORDER — FENTANYL CITRATE (PF) 100 MCG/2ML IJ SOLN
INTRAMUSCULAR | Status: AC
  Filled 2019-02-16: qty 2

## 2019-02-16 MED ORDER — 0.9 % SODIUM CHLORIDE (POUR BTL) OPTIME
TOPICAL | Status: DC | PRN
  Administered 2019-02-16: 1000 mL

## 2019-02-16 MED ORDER — FENTANYL CITRATE (PF) 100 MCG/2ML IJ SOLN: 25.0000 ug | INTRAMUSCULAR | Status: DC | PRN

## 2019-02-16 MED ORDER — SUGAMMADEX SODIUM 200 MG/2ML IV SOLN
INTRAVENOUS | Status: DC | PRN
  Administered 2019-02-16: 200 mg via INTRAVENOUS

## 2019-02-16 MED ORDER — MIDAZOLAM HCL 5 MG/5ML IJ SOLN
INTRAMUSCULAR | Status: DC | PRN
  Administered 2019-02-16: 2 mg via INTRAVENOUS

## 2019-02-16 MED ORDER — OXYCODONE HCL 5 MG PO TABS
10.0000 mg | ORAL_TABLET | ORAL | Status: DC | PRN
  Administered 2019-02-16 – 2019-02-18 (×5): 15 mg via ORAL
  Administered 2019-02-18: 10 mg via ORAL
  Administered 2019-02-18: 15 mg via ORAL
  Filled 2019-02-16 (×3): qty 3
  Filled 2019-02-16: qty 2
  Filled 2019-02-16 (×3): qty 3

## 2019-02-16 MED ORDER — PHENYLEPHRINE 40 MCG/ML (10ML) SYRINGE FOR IV PUSH (FOR BLOOD PRESSURE SUPPORT)
PREFILLED_SYRINGE | INTRAVENOUS | Status: AC
  Filled 2019-02-16: qty 20

## 2019-02-16 MED ORDER — PROPOFOL 10 MG/ML IV BOLUS
INTRAVENOUS | Status: DC | PRN
  Administered 2019-02-16: 200 mg via INTRAVENOUS

## 2019-02-16 MED ORDER — ONDANSETRON HCL 4 MG/2ML IJ SOLN
INTRAMUSCULAR | Status: AC
  Filled 2019-02-16: qty 4

## 2019-02-16 MED ORDER — SUCCINYLCHOLINE CHLORIDE 200 MG/10ML IV SOSY
PREFILLED_SYRINGE | INTRAVENOUS | Status: AC
  Filled 2019-02-16: qty 10

## 2019-02-16 MED ORDER — DEXAMETHASONE SODIUM PHOSPHATE 10 MG/ML IJ SOLN
INTRAMUSCULAR | Status: DC | PRN
  Administered 2019-02-16: 5 mg via INTRAVENOUS

## 2019-02-16 MED ORDER — DEXAMETHASONE SODIUM PHOSPHATE 10 MG/ML IJ SOLN
INTRAMUSCULAR | Status: AC
  Filled 2019-02-16: qty 2

## 2019-02-16 MED ORDER — ONDANSETRON HCL 4 MG PO TABS: 4.0000 mg | ORAL_TABLET | Freq: Four times a day (QID) | ORAL | Status: DC | PRN

## 2019-02-16 MED ORDER — ROCURONIUM BROMIDE 10 MG/ML (PF) SYRINGE
PREFILLED_SYRINGE | INTRAVENOUS | Status: AC
  Filled 2019-02-16: qty 30

## 2019-02-16 MED ORDER — DEXMEDETOMIDINE HCL IN NACL 200 MCG/50ML IV SOLN
INTRAVENOUS | Status: AC
  Filled 2019-02-16: qty 50

## 2019-02-16 MED ORDER — MEPERIDINE HCL 25 MG/ML IJ SOLN: 6.2500 mg | INTRAMUSCULAR | Status: DC | PRN

## 2019-02-16 MED ORDER — ROCURONIUM BROMIDE 10 MG/ML (PF) SYRINGE
PREFILLED_SYRINGE | INTRAVENOUS | Status: DC | PRN
  Administered 2019-02-16 (×2): 20 mg via INTRAVENOUS
  Administered 2019-02-16: 100 mg via INTRAVENOUS

## 2019-02-16 MED ORDER — FENTANYL CITRATE (PF) 250 MCG/5ML IJ SOLN
INTRAMUSCULAR | Status: AC
  Filled 2019-02-16: qty 5

## 2019-02-16 MED ORDER — ACETAMINOPHEN 10 MG/ML IV SOLN
INTRAVENOUS | Status: AC
  Filled 2019-02-16: qty 100

## 2019-02-16 MED ORDER — ENOXAPARIN SODIUM 60 MG/0.6ML ~~LOC~~ SOLN
55.0000 mg | SUBCUTANEOUS | Status: DC
  Administered 2019-02-17 – 2019-02-18 (×2): 55 mg via SUBCUTANEOUS
  Filled 2019-02-16 (×2): qty 0.6

## 2019-02-16 MED ORDER — SENNA 8.6 MG PO TABS
1.0000 | ORAL_TABLET | Freq: Two times a day (BID) | ORAL | Status: DC
  Administered 2019-02-16 – 2019-02-18 (×4): 8.6 mg via ORAL
  Filled 2019-02-16 (×4): qty 1

## 2019-02-16 MED ORDER — ONDANSETRON HCL 4 MG/2ML IJ SOLN: 4.0000 mg | Freq: Four times a day (QID) | INTRAMUSCULAR | Status: DC | PRN

## 2019-02-16 MED ORDER — PROPOFOL 10 MG/ML IV BOLUS
INTRAVENOUS | Status: AC
  Filled 2019-02-16: qty 20

## 2019-02-16 MED ORDER — ONDANSETRON HCL 4 MG/2ML IJ SOLN
INTRAMUSCULAR | Status: DC | PRN
  Administered 2019-02-16: 4 mg via INTRAVENOUS

## 2019-02-16 MED ORDER — MUPIROCIN 2 % EX OINT
TOPICAL_OINTMENT | Freq: Two times a day (BID) | CUTANEOUS | Status: DC
  Administered 2019-02-16 – 2019-02-18 (×5): via NASAL
  Filled 2019-02-16 (×2): qty 22

## 2019-02-16 MED ORDER — ENOXAPARIN SODIUM 40 MG/0.4ML ~~LOC~~ SOLN: 40.0000 mg | SUBCUTANEOUS | Status: DC

## 2019-02-16 MED ORDER — LACTATED RINGERS IV SOLN
INTRAVENOUS | Status: DC
  Administered 2019-02-16 (×2): via INTRAVENOUS

## 2019-02-16 MED ORDER — ACETAMINOPHEN 325 MG PO TABS
325.0000 mg | ORAL_TABLET | Freq: Four times a day (QID) | ORAL | Status: DC | PRN
  Filled 2019-02-16: qty 2

## 2019-02-16 MED ORDER — HYDROMORPHONE HCL 1 MG/ML IJ SOLN
0.5000 mg | INTRAMUSCULAR | Status: DC | PRN
  Administered 2019-02-16 – 2019-02-17 (×5): 1 mg via INTRAVENOUS
  Filled 2019-02-16 (×6): qty 1

## 2019-02-16 MED ORDER — DEXMEDETOMIDINE HCL 200 MCG/2ML IV SOLN
INTRAVENOUS | Status: DC | PRN
  Administered 2019-02-16: 8 ug via INTRAVENOUS
  Administered 2019-02-16: 16 ug via INTRAVENOUS

## 2019-02-16 MED ORDER — CEFAZOLIN SODIUM-DEXTROSE 2-4 GM/100ML-% IV SOLN
INTRAVENOUS | Status: AC
  Filled 2019-02-16: qty 100

## 2019-02-16 MED ORDER — OXYCODONE HCL 5 MG PO TABS: 5.0000 mg | ORAL_TABLET | ORAL | Status: DC | PRN

## 2019-02-16 MED ORDER — LACTATED RINGERS IV SOLN: INTRAVENOUS | Status: DC

## 2019-02-16 MED ORDER — DIPHENHYDRAMINE HCL 12.5 MG/5ML PO ELIX: 12.5000 mg | ORAL_SOLUTION | ORAL | Status: DC | PRN

## 2019-02-16 MED ORDER — METOCLOPRAMIDE HCL 5 MG/ML IJ SOLN: 10.0000 mg | Freq: Once | INTRAMUSCULAR | Status: DC | PRN

## 2019-02-16 MED ORDER — ARTIFICIAL TEARS OPHTHALMIC OINT
TOPICAL_OINTMENT | OPHTHALMIC | Status: AC
  Filled 2019-02-16: qty 3.5

## 2019-02-16 MED ORDER — LIDOCAINE 2% (20 MG/ML) 5 ML SYRINGE
INTRAMUSCULAR | Status: AC
  Filled 2019-02-16: qty 10

## 2019-02-16 MED ORDER — DOCUSATE SODIUM 100 MG PO CAPS
100.0000 mg | ORAL_CAPSULE | Freq: Two times a day (BID) | ORAL | Status: DC
  Administered 2019-02-16 – 2019-02-18 (×4): 100 mg via ORAL
  Filled 2019-02-16 (×4): qty 1

## 2019-02-16 MED ORDER — LIDOCAINE 2% (20 MG/ML) 5 ML SYRINGE
INTRAMUSCULAR | Status: DC | PRN
  Administered 2019-02-16: 100 mg via INTRAVENOUS

## 2019-02-16 MED ORDER — CEFAZOLIN SODIUM-DEXTROSE 2-4 GM/100ML-% IV SOLN
2.0000 g | INTRAVENOUS | Status: AC
  Administered 2019-02-16: 2 g via INTRAVENOUS

## 2019-02-16 SURGICAL SUPPLY — 95 items
ALCOHOL 70% 16 OZ (MISCELLANEOUS) ×3 IMPLANT
BANDAGE ESMARK 6X9 LF (GAUZE/BANDAGES/DRESSINGS) ×1 IMPLANT
BIT DRILL 2.5X2.75 QC CALB (BIT) ×3 IMPLANT
BIT DRILL 2.9 CANN QC NONSTRL (BIT) ×3 IMPLANT
BIT DRILL 3.5X5.5 QC CALB (BIT) ×3 IMPLANT
BIT DRILL 4.8X200 CANN (BIT) ×3 IMPLANT
BIT DRILL CALIBRATED 2.7 (BIT) ×2 IMPLANT
BIT DRILL CALIBRATED 2.7MM (BIT) ×1
BLADE SURG 10 STRL SS (BLADE) ×3 IMPLANT
BLADE SURG 15 STRL LF DISP TIS (BLADE) ×2 IMPLANT
BLADE SURG 15 STRL SS (BLADE) ×4
BNDG COHESIVE 4X5 TAN STRL (GAUZE/BANDAGES/DRESSINGS) ×3 IMPLANT
BNDG COHESIVE 6X5 TAN STRL LF (GAUZE/BANDAGES/DRESSINGS) ×3 IMPLANT
BNDG ELASTIC 4X5.8 VLCR STR LF (GAUZE/BANDAGES/DRESSINGS) ×3 IMPLANT
BNDG ELASTIC 6X15 VLCR STRL LF (GAUZE/BANDAGES/DRESSINGS) ×3 IMPLANT
BNDG ESMARK 6X9 LF (GAUZE/BANDAGES/DRESSINGS) ×3
BNDG GAUZE ELAST 4 BULKY (GAUZE/BANDAGES/DRESSINGS) ×12 IMPLANT
BONE CANC CHIPS 20CC PCAN1/4 (Bone Implant) ×3 IMPLANT
CANISTER SUCT 3000ML PPV (MISCELLANEOUS) ×3 IMPLANT
CHIPS CANC BONE 20CC PCAN1/4 (Bone Implant) ×1 IMPLANT
CHLORAPREP W/TINT 26 (MISCELLANEOUS) ×6 IMPLANT
COVER SURGICAL LIGHT HANDLE (MISCELLANEOUS) ×3 IMPLANT
COVER WAND RF STERILE (DRAPES) ×3 IMPLANT
CUFF TOURN SGL QUICK 34 (TOURNIQUET CUFF) ×2
CUFF TOURN SGL QUICK 42 (TOURNIQUET CUFF) IMPLANT
CUFF TRNQT CYL 34X4.125X (TOURNIQUET CUFF) ×1 IMPLANT
DEVICE FIXATION SYNDESMOSIS (Bone Implant) ×3 IMPLANT
DRAPE OEC MINIVIEW 54X84 (DRAPES) ×3 IMPLANT
DRAPE U-SHAPE 47X51 STRL (DRAPES) ×3 IMPLANT
DRSG ADAPTIC 3X8 NADH LF (GAUZE/BANDAGES/DRESSINGS) IMPLANT
DRSG MEPILEX BORDER 4X4 (GAUZE/BANDAGES/DRESSINGS) ×9 IMPLANT
DRSG MEPITEL 4X7.2 (GAUZE/BANDAGES/DRESSINGS) ×3 IMPLANT
DRSG OPSITE POSTOP 4X8 (GAUZE/BANDAGES/DRESSINGS) ×3 IMPLANT
DRSG PAD ABDOMINAL 8X10 ST (GAUZE/BANDAGES/DRESSINGS) ×36 IMPLANT
DRSG TELFA 3X8 NADH (GAUZE/BANDAGES/DRESSINGS) ×18 IMPLANT
ELECT REM PT RETURN 9FT ADLT (ELECTROSURGICAL) ×3
ELECTRODE REM PT RTRN 9FT ADLT (ELECTROSURGICAL) ×1 IMPLANT
FIXATION ZIPTIGHT ANKLE SNDSMS (Ankle) ×1 IMPLANT
GAUZE SPONGE 4X4 12PLY STRL (GAUZE/BANDAGES/DRESSINGS) ×3 IMPLANT
GLOVE BIO SURGEON STRL SZ 6.5 (GLOVE) ×2 IMPLANT
GLOVE BIO SURGEON STRL SZ8 (GLOVE) ×9 IMPLANT
GLOVE BIO SURGEONS STRL SZ 6.5 (GLOVE) ×1
GLOVE BIOGEL PI IND STRL 7.0 (GLOVE) ×1 IMPLANT
GLOVE BIOGEL PI IND STRL 8 (GLOVE) ×2 IMPLANT
GLOVE BIOGEL PI INDICATOR 7.0 (GLOVE) ×2
GLOVE BIOGEL PI INDICATOR 8 (GLOVE) ×4
GLOVE ECLIPSE 8.0 STRL XLNG CF (GLOVE) ×6 IMPLANT
GOWN STRL REUS W/ TWL LRG LVL3 (GOWN DISPOSABLE) ×1 IMPLANT
GOWN STRL REUS W/ TWL XL LVL3 (GOWN DISPOSABLE) ×2 IMPLANT
GOWN STRL REUS W/TWL LRG LVL3 (GOWN DISPOSABLE) ×2
GOWN STRL REUS W/TWL XL LVL3 (GOWN DISPOSABLE) ×4
K-WIRE ACE 1.6X6 (WIRE) ×6
KIT BASIN OR (CUSTOM PROCEDURE TRAY) ×3 IMPLANT
KIT TURNOVER KIT B (KITS) ×3 IMPLANT
KWIRE ACE 1.6X6 (WIRE) ×2 IMPLANT
NEEDLE 22X1 1/2 (OR ONLY) (NEEDLE) IMPLANT
NS IRRIG 1000ML POUR BTL (IV SOLUTION) ×3 IMPLANT
PACK ORTHO EXTREMITY (CUSTOM PROCEDURE TRAY) ×3 IMPLANT
PAD ARMBOARD 7.5X6 YLW CONV (MISCELLANEOUS) ×6 IMPLANT
PAD CAST 4YDX4 CTTN HI CHSV (CAST SUPPLIES) ×2 IMPLANT
PADDING CAST COTTON 4X4 STRL (CAST SUPPLIES) ×4
PIN GUIDE DRILL TIP 2.8X300 (DRILL) ×6 IMPLANT
PIN SHANTZ 5MM (PIN) ×3 IMPLANT
PLATE ACE 100DE 10H (Plate) ×3 IMPLANT
SCREW ACE CAN 4.0 40M (Screw) ×6 IMPLANT
SCREW CORTICAL 3.5MM  12MM (Screw) ×8 IMPLANT
SCREW CORTICAL 3.5MM 12MM (Screw) ×4 IMPLANT
SCREW FULL THREAD 8.0X75MM (Screw) ×3 IMPLANT
SCREW FULL THREAD 8.0X80MM (Screw) ×3 IMPLANT
SOAP 2 % CHG 4 OZ (WOUND CARE) ×6 IMPLANT
SPONGE LAP 18X18 RF (DISPOSABLE) ×6 IMPLANT
STAPLER SKIN PROX 35W (STAPLE) ×3 IMPLANT
STAPLER SKIN PROX WIDE 3.9 (STAPLE) ×3 IMPLANT
STOCKINETTE IMPERVIOUS 9X36 MD (GAUZE/BANDAGES/DRESSINGS) ×3 IMPLANT
SUCTION FRAZIER HANDLE 10FR (MISCELLANEOUS) ×2
SUCTION TUBE FRAZIER 10FR DISP (MISCELLANEOUS) ×1 IMPLANT
SUT ETHILON 3 0 PS 1 (SUTURE) ×3 IMPLANT
SUT ETHILON 4 0 PS 2 18 (SUTURE) ×6 IMPLANT
SUT MNCRL AB 3-0 PS2 18 (SUTURE) ×6 IMPLANT
SUT PROLENE 3 0 PS 2 (SUTURE) ×3 IMPLANT
SUT VIC AB 0 CT1 18XCR BRD 8 (SUTURE) IMPLANT
SUT VIC AB 0 CT1 18XCR BRD8 (SUTURE) ×1 IMPLANT
SUT VIC AB 0 CT1 8-18 (SUTURE) ×2
SUT VIC AB 2-0 CT1 27 (SUTURE) ×4
SUT VIC AB 2-0 CT1 TAPERPNT 27 (SUTURE) ×2 IMPLANT
SUT VIC AB 3-0 PS2 18 (SUTURE) ×2
SUT VIC AB 3-0 PS2 18XBRD (SUTURE) ×1 IMPLANT
SYR CONTROL 10ML LL (SYRINGE) IMPLANT
TOWEL GREEN STERILE (TOWEL DISPOSABLE) ×3 IMPLANT
TOWEL GREEN STERILE FF (TOWEL DISPOSABLE) ×3 IMPLANT
TUBE CONNECTING 12'X1/4 (SUCTIONS) ×1
TUBE CONNECTING 12X1/4 (SUCTIONS) ×2 IMPLANT
WATER STERILE IRR 1000ML POUR (IV SOLUTION) ×3 IMPLANT
YANKAUER SUCT BULB TIP NO VENT (SUCTIONS) ×3 IMPLANT
ZIPTIGHT ANKLE SYNODESMOSS FIX (Ankle) ×3 IMPLANT

## 2019-02-16 NOTE — Discharge Instructions (Addendum)
Wylene Simmer, MD [facility]  Please read the following information regarding your care after surgery.  Medications  You only need a prescription for the narcotic pain medicine (ex. oxycodone, Percocet, Norco).  All of the other medicines listed below are available over the counter. X Aleve 2 pills twice a day for the first 3 days after surgery. X acetominophen (Tylenol) 650 mg every 4-6 hours as you need for minor to moderate pain X oxycodone as prescribed for severe pain  Narcotic pain medicine (ex. oxycodone, Percocet, Vicodin) will cause constipation.  To prevent this problem, take the following medicines while you are taking any pain medicine. X docusate sodium (Colace) 100 mg twice a day X senna (Senokot) 2 tablets twice a day  X To help prevent blood clots, take a baby aspirin (81 mg) twice a day after surgery.  You should also get up every hour while you are awake to move around.    Weight Bearing X Bear weight when you are able on your operated RIGHT leg or foot in the CAM boot. X Do not bear any weight on the operated LEFT leg or foot.  Cast / Splint / Dressing X Keep your splint, cast or dressing clean and dry.  Dont put anything (coat hanger, pencil, etc) down inside of it.  If it gets damp, use a hair dryer on the cool setting to dry it.  If it gets soaked, call the office to schedule an appointment for a cast change.   After your dressing, cast or splint is removed; you may shower, but do not soak or scrub the wound.  Allow the water to run over it, and then gently pat it dry.  Swelling It is normal for you to have swelling where you had surgery.  To reduce swelling and pain, keep your toes above your nose for at least 3 days after surgery.  It may be necessary to keep your foot or leg elevated for several weeks.  If it hurts, it should be elevated.  Follow Up Call my office at [phone] when you are discharged from the hospital or surgery center to schedule an  appointment to be seen two weeks after surgery.  Call my office at [phone] if you develop a fever >101.5 F, nausea, vomiting, bleeding from the surgical site or severe pain.     Bacitracin/Neosporin ointment to road rash twice a day and cover with dry bandage.
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
## 2019-02-16 NOTE — Progress Notes (Signed)
OT Cancellation Note  Patient Details Name: Willie Martin MRN: [id number] DOB: 11/26/1990   Cancelled Treatment:    Reason Eval/Treat Not Completed: Patient not medically ready.  Plan for surgery today.  Will defer OT eval until after procedure.  Lucille Passy, OTR/L Acute Rehabilitation Services Pager [phone] Office [phone]   Lucille Passy M 02/16/2019, 6:02 AM
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
## 2019-02-16 NOTE — Anesthesia Procedure Notes (Addendum)
Procedure Name: Intubation Date/Time: 02/16/2019 2:34 PM Performed by: Colin Benton, CRNA Pre-anesthesia Checklist: Patient identified, Emergency Drugs available, Suction available and Patient being monitored Patient Re-evaluated:Patient Re-evaluated prior to induction Oxygen Delivery Method: Circle system utilized Preoxygenation: Pre-oxygenation with 100% oxygen Induction Type: IV induction Ventilation: Mask ventilation without difficulty Laryngoscope Size: Mac and 3 Grade View: Grade I Tube type: Oral Tube size: 7.5 mm Number of attempts: 1 Airway Equipment and Method: Stylet Placement Confirmation: ETT inserted through vocal cords under direct vision,  positive ETCO2 and breath sounds checked- equal and bilateral Secured at: 21 cm Tube secured with: Tape Dental Injury: Teeth and Oropharynx as per pre-operative assessment

## 2019-02-16 NOTE — Anesthesia Postprocedure Evaluation (Signed)
Anesthesia Post Note  Patient: NIKOLAY DEMETRIOU  Procedure(s) Performed: OPEN REDUCTION INTERNAL FIXATION (ORIF) ANKLE FRACTURE (Left ) SYNDESMOSIS REPAIR (Left ) OPEN REDUCTION INTERNAL FIXATION (ORIF) CALCANEOUS FRACTURE FUSION (Left )     Patient location during evaluation: PACU Anesthesia Type: General Level of consciousness: awake and alert Pain management: pain level controlled Vital Signs Assessment: post-procedure vital signs reviewed and stable Respiratory status: spontaneous breathing, nonlabored ventilation, respiratory function stable and patient connected to nasal cannula oxygen Cardiovascular status: blood pressure returned to baseline and stable Postop Assessment: no apparent nausea or vomiting Anesthetic complications: no    Last Vitals:  Vitals:   02/16/19 1854 02/16/19 1940  BP: (!) 153/78 (!) 147/55  Pulse: 95 98  Resp: 17 18  Temp: 37.2 C 36.8 C  SpO2: 94% 99%    Last Pain:  Vitals:   02/16/19 1940  TempSrc: Oral  PainSc:                  Ryan P Ellender

## 2019-02-16 NOTE — Interval H&P Note (Signed)
History and Physical Interval Note:  02/16/2019 2:17 PM  Willie Martin FUXNATFTDD  has presented today for surgery, with the diagnosis of Left Ankle Fracture, Left Calcaneus, Left Syndesmosis Sprain..  The various methods of treatment have been discussed with the patient and family. After consideration of risks, benefits and other options for treatment, the patient has consented to  Procedure(s): OPEN REDUCTION INTERNAL FIXATION (ORIF) ANKLE FRACTURE (Left) SYNDESMOSIS REPAIR (Left) OPEN REDUCTION INTERNAL FIXATION (ORIF) CALCANEOUS FRACTURE (Left) as a surgical intervention.  The patient's history has been reviewed, patient examined, no change in status, stable for surgery.  I have reviewed the patient's chart and labs.  Questions were answered to the patient's satisfaction.     Wylene Simmer  The risks and benefits of the alternative treatment options have been discussed in detail.  The patient wishes to proceed with surgery and specifically understands risks of bleeding, infection, nerve damage, blood clots, need for additional surgery, amputation and death.
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
## 2019-02-16 NOTE — Transfer of Care (Signed)
Immediate Anesthesia Transfer of Care Note  Patient: NOCHOLAS DAMASO  Procedure(s) Performed: OPEN REDUCTION INTERNAL FIXATION (ORIF) ANKLE FRACTURE (Left ) SYNDESMOSIS REPAIR (Left ) OPEN REDUCTION INTERNAL FIXATION (ORIF) CALCANEOUS FRACTURE FUSION (Left )  Patient Location: PACU  Anesthesia Type:General  Level of Consciousness: awake, alert , oriented and patient cooperative  Airway & Oxygen Therapy: Patient Spontanous Breathing and Patient connected to face mask oxygen  Post-op Assessment: Report given to RN and Post -op Vital signs reviewed and stable  Post vital signs: Reviewed and stable  Last Vitals:  Vitals Value Taken Time  BP 154/56 02/16/19 1755  Temp    Pulse 96 02/16/19 1802  Resp 21 02/16/19 1802  SpO2 100 % 02/16/19 1802  Vitals shown include unvalidated device data.  Last Pain:  Vitals:   02/16/19 1757  TempSrc:   PainSc: (P) Asleep         Complications: No apparent anesthesia complications

## 2019-02-16 NOTE — Anesthesia Postprocedure Evaluation (Signed)
Anesthesia Post Note  Patient: Willie Martin  Procedure(s) Performed: I&D with Tibial Intramedullary Nailing (Right Leg Lower) Closed Reduction Ankle Dislocation (Left Ankle)     Patient location during evaluation: PACU Anesthesia Type: General Level of consciousness: awake and alert Pain management: pain level controlled Vital Signs Assessment: post-procedure vital signs reviewed and stable Respiratory status: spontaneous breathing, nonlabored ventilation, respiratory function stable and patient connected to nasal cannula oxygen Cardiovascular status: blood pressure returned to baseline and stable Postop Assessment: no apparent nausea or vomiting Anesthetic complications: no    Last Vitals:  Vitals:   02/16/19 0600 02/16/19 0700  BP: 119/66 (!) 122/51  Pulse: 100   Resp: 13 18  Temp:    SpO2: 96%     Last Pain:  Vitals:   02/16/19 0403  TempSrc:   PainSc: [name]

## 2019-02-17 ENCOUNTER — Encounter (HOSPITAL_COMMUNITY): Payer: Self-pay | Admitting: Orthopedic Surgery

## 2019-02-17 LAB — CBC
HCT: 30.1 % — ABNORMAL LOW (ref 39.0–52.0)
Hemoglobin: 9.9 g/dL — ABNORMAL LOW (ref 13.0–17.0)
MCH: 30.6 pg (ref 26.0–34.0)
MCHC: 32.9 g/dL (ref 30.0–36.0)
MCV: 92.9 fL (ref 80.0–100.0)
Platelets: 265 10*3/uL (ref 150–400)
RBC: 3.24 MIL/uL — ABNORMAL LOW (ref 4.22–5.81)
RDW: 11.9 % (ref 11.5–15.5)
WBC: 10.8 10*3/uL — ABNORMAL HIGH (ref 4.0–10.5)
nRBC: 0 % (ref 0.0–0.2)

## 2019-02-17 MED ORDER — BACITRACIN ZINC 500 UNIT/GM EX OINT: TOPICAL_OINTMENT | Freq: Two times a day (BID) | CUTANEOUS | Status: DC

## 2019-02-17 MED ORDER — SENNA 8.6 MG PO TABS: 2.0000 | ORAL_TABLET | Freq: Two times a day (BID) | ORAL | 0 refills | Status: DC

## 2019-02-17 MED ORDER — ACETAMINOPHEN 500 MG PO TABS
1000.0000 mg | ORAL_TABLET | Freq: Four times a day (QID) | ORAL | Status: DC
  Administered 2019-02-17 – 2019-02-18 (×4): 1000 mg via ORAL
  Filled 2019-02-17 (×4): qty 2

## 2019-02-17 MED ORDER — ASPIRIN EC 81 MG PO TBEC: 81.0000 mg | DELAYED_RELEASE_TABLET | Freq: Two times a day (BID) | ORAL | 0 refills | Status: DC

## 2019-02-17 MED ORDER — DOCUSATE SODIUM 100 MG PO CAPS: 100.0000 mg | ORAL_CAPSULE | Freq: Two times a day (BID) | ORAL | 0 refills | Status: DC

## 2019-02-17 MED ORDER — OXYCODONE HCL 5 MG PO TABS: 5.0000 mg | ORAL_TABLET | ORAL | 0 refills | Status: DC | PRN

## 2019-02-17 MED ORDER — NICOTINE 21 MG/24HR TD PT24
21.0000 mg | MEDICATED_PATCH | Freq: Every day | TRANSDERMAL | Status: DC
  Administered 2019-02-17 – 2019-02-18 (×2): 21 mg via TRANSDERMAL
  Filled 2019-02-17 (×2): qty 1
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
## 2019-02-17 NOTE — Progress Notes (Signed)
Inpatient Rehabilitation Admissions Coordinator  Inpatient rehab consult received. I met with patient and his fiance at bedside for rehab assessment. We discussed goals and expectations of an inpt rehab admit. They both prefer d/c home. They will be staying with his parents and have 24/7 assist from family. DME will need to be determined. I will follow up tomorrow to assess if this plan still remains realistic.  Danne Baxter, RN, MSN Rehab Admissions Coordinator [phone] 02/17/2019 4:18 PM

## 2019-02-17 NOTE — Progress Notes (Signed)
Subjective: 1 Day Post-Op Procedure(s) (LRB): OPEN REDUCTION INTERNAL FIXATION (ORIF) ANKLE FRACTURE (Left) SYNDESMOSIS REPAIR (Left) OPEN REDUCTION INTERNAL FIXATION (ORIF) CALCANEOUS FRACTURE FUSION (Left)  Patient reports pain as mild to moderate.  Tolerating POs well.  Admits to flatus.  Denies fever, chills, N/V, CP, SOB.  Resting comfortably in bed.  Objective:   VITALS:  Temp:  [97.2 F (36.2 C)-98.9 F (37.2 C)] 98.4 F (36.9 C) (08/05 0438) Pulse Rate:  [95-103] 98 (08/05 0438) Resp:  [17-27] 18 (08/05 0438) BP: (108-154)/(44-80) 149/60 (08/05 0438) SpO2:  [94 %-100 %] 96 % (08/05 0438)  General: WDWN patient in NAD. Psych:  Appropriate mood and affect. Neuro:  A&O x 3, Moving all extremities, sensation intact to light touch HEENT:  EOMs intact Chest:  Even non-labored respirations Skin:  SLS on L LE and dressing on R LE C/D/I, no rashes or lesions Extremities: warm/dry, mild edema, no erythema or echymosis.  No lymphadenopathy. Pulses: Popliteus 2+ MSK:  ROM: EHL/FHL intact, MMT: able to perform quad set    LABS Recent Labs    02/14/19 2327  02/14/19 2332 02/14/19 2347 02/16/19 0539 02/17/19 0244  HGB 16.3  16.3  --  16.7 17.3* 11.2* 9.9*  WBC  --    < > 37.0*  --  10.3 10.8*  PLT  --    < > 482*  --  247 265   < > = values in this interval not displayed.   Recent Labs    02/15/19 0814 02/16/19 0539  NA 140 139  K 4.3 4.4  CL 110 107  CO2 22 24  BUN 8 14  CREATININE 1.27* 1.23  GLUCOSE 164* 112*   Recent Labs    02/14/19 2332  INR 1.0     Assessment/Plan: 1 Day Post-Op Procedure(s) (LRB): OPEN REDUCTION INTERNAL FIXATION (ORIF) ANKLE FRACTURE (Left) SYNDESMOSIS REPAIR (Left) OPEN REDUCTION INTERNAL FIXATION (ORIF) CALCANEOUS FRACTURE FUSION (Left)  NWB L LE WBAT in CAM boot R LE Up with therapy D/C Pain control.   Oxycodone 5 mg  DVT prophylaxis: ASA 81 mg bid Scripts sent to pharmacy Plan for outpatient post-op visit with Dr.  Doran Durand Ortho signing off Please call with any questions/concerns.  Mechele Claude PA-C [facility] Office:  [phone]

## 2019-02-17 NOTE — Progress Notes (Signed)
Sent message regarding pt requesting nicotine patch and HR sustaining in 120s. Will continue to monitor pt.
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
## 2019-02-18 LAB — BASIC METABOLIC PANEL
Anion gap: 10 (ref 5–15)
BUN: 8 mg/dL (ref 6–20)
CO2: 26 mmol/L (ref 22–32)
Calcium: 8.1 mg/dL — ABNORMAL LOW (ref 8.9–10.3)
Chloride: 103 mmol/L (ref 98–111)
Creatinine, Ser: 0.96 mg/dL (ref 0.61–1.24)
GFR calc Af Amer: >60 mL/min (ref >60)
GFR calc non Af Amer: >60 mL/min (ref >60)
Glucose, Bld: 120 mg/dL — ABNORMAL HIGH (ref 70–99)
Potassium: 4.1 mmol/L (ref 3.5–5.1)
Sodium: 139 mmol/L (ref 135–145)

## 2019-02-18 LAB — CBC
HCT: 29.2 % — ABNORMAL LOW (ref 39.0–52.0)
Hemoglobin: 9.5 g/dL — ABNORMAL LOW (ref 13.0–17.0)
MCH: 30.7 pg (ref 26.0–34.0)
MCHC: 32.5 g/dL (ref 30.0–36.0)
MCV: 94.5 fL (ref 80.0–100.0)
Platelets: 274 10*3/uL (ref 150–400)
RBC: 3.09 MIL/uL — ABNORMAL LOW (ref 4.22–5.81)
RDW: 12 % (ref 11.5–15.5)
WBC: 8 10*3/uL (ref 4.0–10.5)
nRBC: 0 % (ref 0.0–0.2)

## 2019-02-18 MED ORDER — BACITRACIN ZINC 500 UNIT/GM EX OINT: TOPICAL_OINTMENT | Freq: Two times a day (BID) | CUTANEOUS | 0 refills | Status: AC

## 2019-02-18 MED ORDER — ASPIRIN EC 81 MG PO TBEC: 81.0000 mg | DELAYED_RELEASE_TABLET | Freq: Two times a day (BID) | ORAL | 0 refills | Status: AC

## 2019-02-18 MED ORDER — ACETAMINOPHEN 500 MG PO TABS: 1000.0000 mg | ORAL_TABLET | Freq: Four times a day (QID) | ORAL | 0 refills | Status: AC | PRN

## 2019-02-18 MED ORDER — GABAPENTIN 300 MG PO CAPS: 300.0000 mg | ORAL_CAPSULE | Freq: Three times a day (TID) | ORAL | 0 refills | Status: AC

## 2019-02-18 MED ORDER — DOCUSATE SODIUM 100 MG PO CAPS: 100.0000 mg | ORAL_CAPSULE | Freq: Two times a day (BID) | ORAL | 0 refills | Status: AC

## 2019-02-18 MED ORDER — SENNA 8.6 MG PO TABS: 2.0000 | ORAL_TABLET | Freq: Two times a day (BID) | ORAL | 0 refills | Status: AC

## 2019-02-18 MED ORDER — METHOCARBAMOL 500 MG PO TABS: 1000.0000 mg | ORAL_TABLET | Freq: Three times a day (TID) | ORAL | 0 refills | Status: AC

## 2019-02-18 MED ORDER — OXYCODONE HCL 5 MG PO TABS: 5.0000 mg | ORAL_TABLET | ORAL | 0 refills | Status: AC | PRN

## 2019-02-18 MED FILL — SENNA 8.6 MG TABS: 8.6 | 8 days supply | Qty: 30 | Fill #0

## 2019-02-18 MED FILL — SM ANTIBIOTIC 500 UNIT/GM O: 500 | 30 days supply | Qty: 57 | Fill #0

## 2019-02-18 MED FILL — METHOCARBAMOL 500 MG TABLET: 500 | 5 days supply | Qty: 30 | Fill #0

## 2019-02-18 MED FILL — ASPIRIN LOW DOSE 81 MG TBEC: 81 | 42 days supply | Qty: 84 | Fill #0

## 2019-02-18 MED FILL — oxyCODONE HCL 5 MG TABS: 5 | 5 days supply | Qty: 30 | Fill #0

## 2019-02-18 MED FILL — DOK 100 MG CAPS: 100 | 15 days supply | Qty: 30 | Fill #0

## 2019-02-18 MED FILL — GABAPENTIN 300 MG CAPSULE: 300 | 10 days supply | Qty: 30 | Fill #0

## 2019-02-18 NOTE — Progress Notes (Signed)
Pt alert, able to make needs known. Continues to have some pain, mostly in the RLE. Pt is able to move from bed to chair with little assist. States that he wants to DC home.
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
## 2019-02-18 NOTE — Plan of Care (Signed)
  Problem: Education: Goal: Required Educational Video(s) Outcome: Adequate for Discharge   Problem: Clinical Measurements: Goal: Ability to maintain clinical measurements within normal limits will improve Outcome: Adequate for Discharge Goal: Postoperative complications will be avoided or minimized Outcome: Adequate for Discharge   Problem: Skin Integrity: Goal: Demonstration of wound healing without infection will improve Outcome: Adequate for Discharge   Problem: Education: Goal: Knowledge of General Education information will improve Description: Including pain rating scale, medication(s)/side effects and non-pharmacologic comfort measures Outcome: Adequate for Discharge   Problem: Health Behavior/Discharge Planning: Goal: Ability to manage health-related needs will improve Outcome: Adequate for Discharge   Problem: Clinical Measurements: Goal: Ability to maintain clinical measurements within normal limits will improve Outcome: Adequate for Discharge Goal: Will remain free from infection Outcome: Adequate for Discharge Goal: Diagnostic test results will improve Outcome: Adequate for Discharge Goal: Respiratory complications will improve Outcome: Adequate for Discharge Goal: Cardiovascular complication will be avoided Outcome: Adequate for Discharge   Problem: Activity: Goal: Risk for activity intolerance will decrease Outcome: Adequate for Discharge   Problem: Nutrition: Goal: Adequate nutrition will be maintained Outcome: Adequate for Discharge   Problem: Coping: Goal: Level of anxiety will decrease Outcome: Adequate for Discharge   Problem: Elimination: Goal: Will not experience complications related to bowel motility Outcome: Adequate for Discharge Goal: Will not experience complications related to urinary retention Outcome: Adequate for Discharge   Problem: Pain Managment: Goal: General experience of comfort will improve Outcome: Adequate for Discharge    Problem: Safety: Goal: Ability to remain free from injury will improve Outcome: Adequate for Discharge   Problem: Skin Integrity: Goal: Risk for impaired skin integrity will decrease Outcome: Adequate for Discharge   

## 2019-02-18 NOTE — Progress Notes (Signed)
Patient suffers from bilateral lower extremity fractures requiring minimal or non weightbaring status which impairs their ability to perform daily activities like bathing and dressing in the home.  A cane or crutch will not resolve issue with performing activities of daily living. A wheelchair will allow patient to safely perform daily activities. Patient can safely propel the wheelchair in the home or has a caregiver who can provide assistance. Length of need 6 months . Accessories: elevating leg rests (ELRs), wheel locks, extensions and anti-tippers.  Henreitta Cea
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
